# Patient Record
Sex: Female | Born: 2000 | Race: Black or African American | Hispanic: No | Marital: Single | Smoking: Never smoker
Health system: Southern US, Community
[De-identification: ages and names within clinical notes are randomized; demographics above are authoritative.]

---
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
## 2020-03-15 ENCOUNTER — Ambulatory Visit
Admission: EM | Admit: 2020-03-15 | Discharge: 2020-03-15 | Disposition: A | Discharge status: Other Acute Inpatient Hospital | Payer: Medicaid Other

## 2020-08-23 IMAGING — CR DG ANKLE COMPLETE 3+V*R*
3 series · 3 of 3 positions shown · non-contrast
Comparison: None.

CLINICAL DATA: Lateral right ankle pain after track injury

EXAM:
RIGHT ANKLE - COMPLETE 3+ VIEW

[ankle ap]
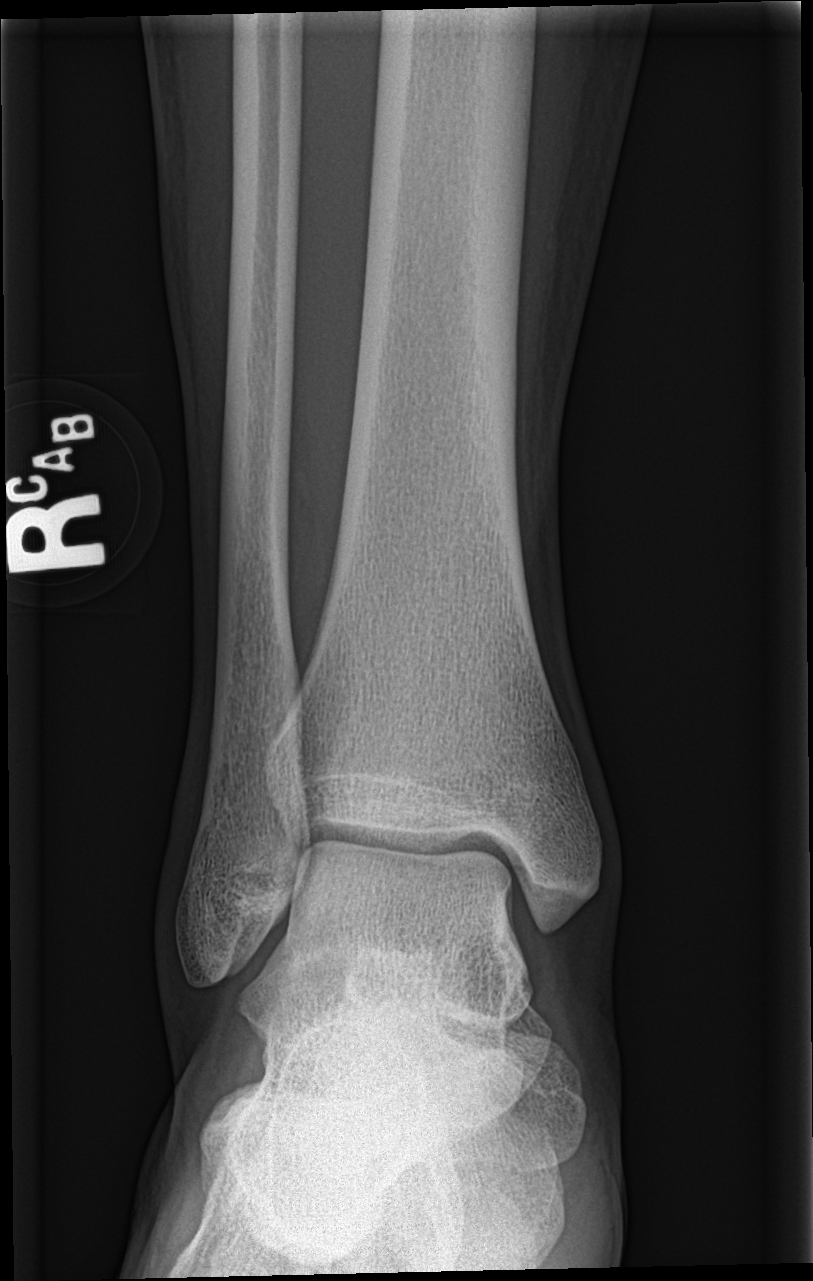

[ankle obl]
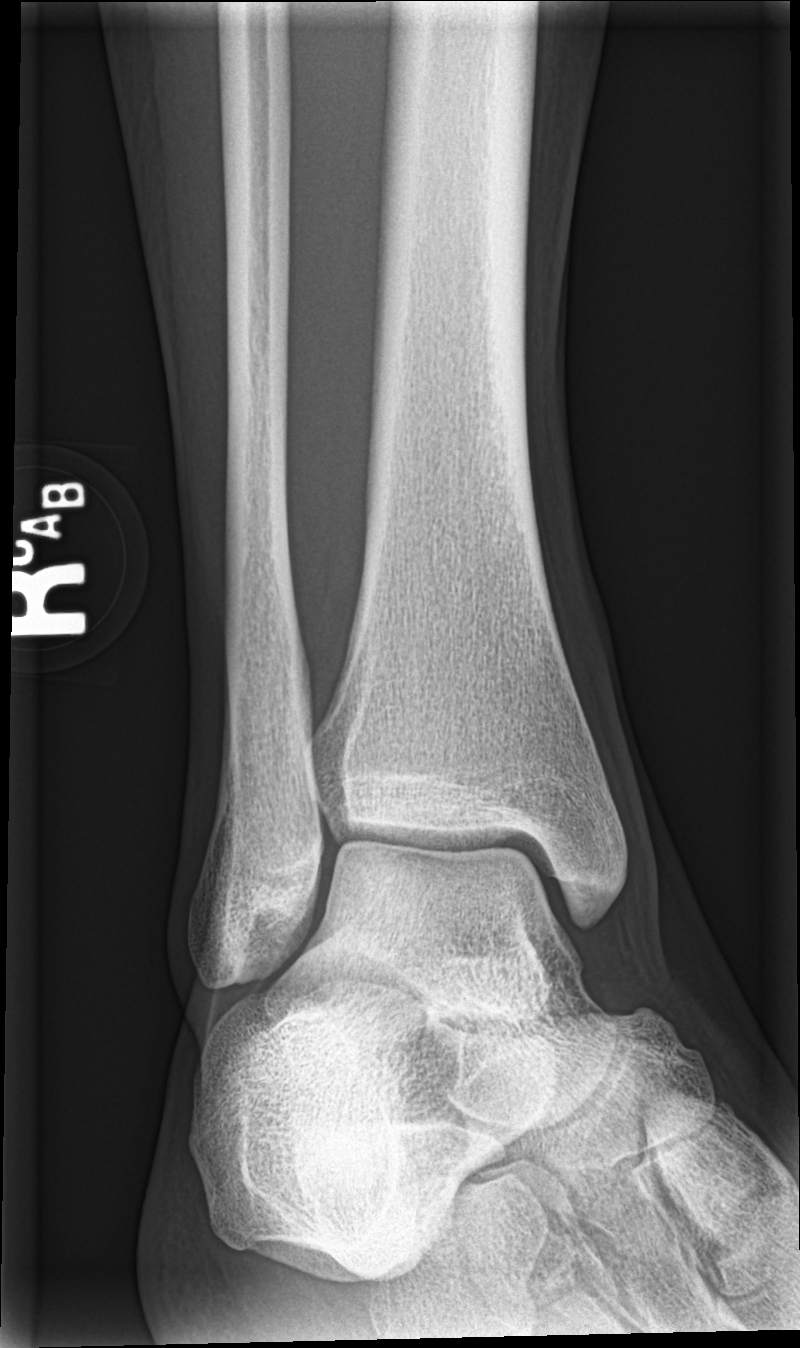

[ankle lat]
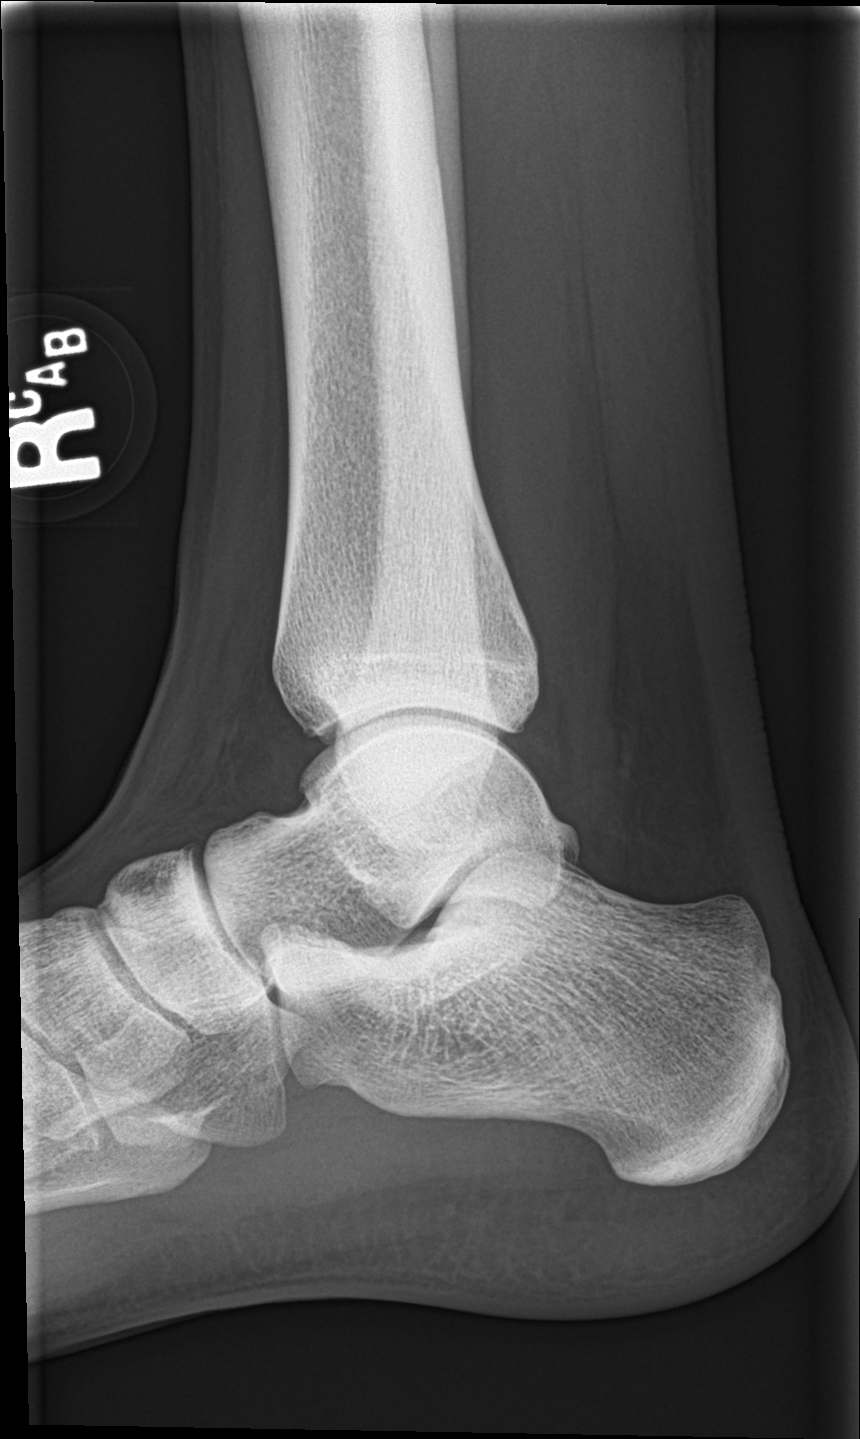

[3 of 3 positions shown; findings below may reference images not displayed]

FINDINGS: There is no evidence of fracture, dislocation, or joint effusion.
There is no evidence of arthropathy or other focal bone abnormality.
Soft tissues are unremarkable.
IMPRESSION: No fracture or subluxation in the right ankle.

## 2020-09-23 IMAGING — CR DG ANKLE COMPLETE 3+V*R*
3 series · 3 of 3 positions shown · non-contrast
Comparison: Radiographs dated 07/18/2019

CLINICAL DATA: Pain and bruising over the lateral malleolus from an
injury on 07/18/2019.

EXAM:
RIGHT ANKLE - COMPLETE 3+ VIEW

[ankle ap]
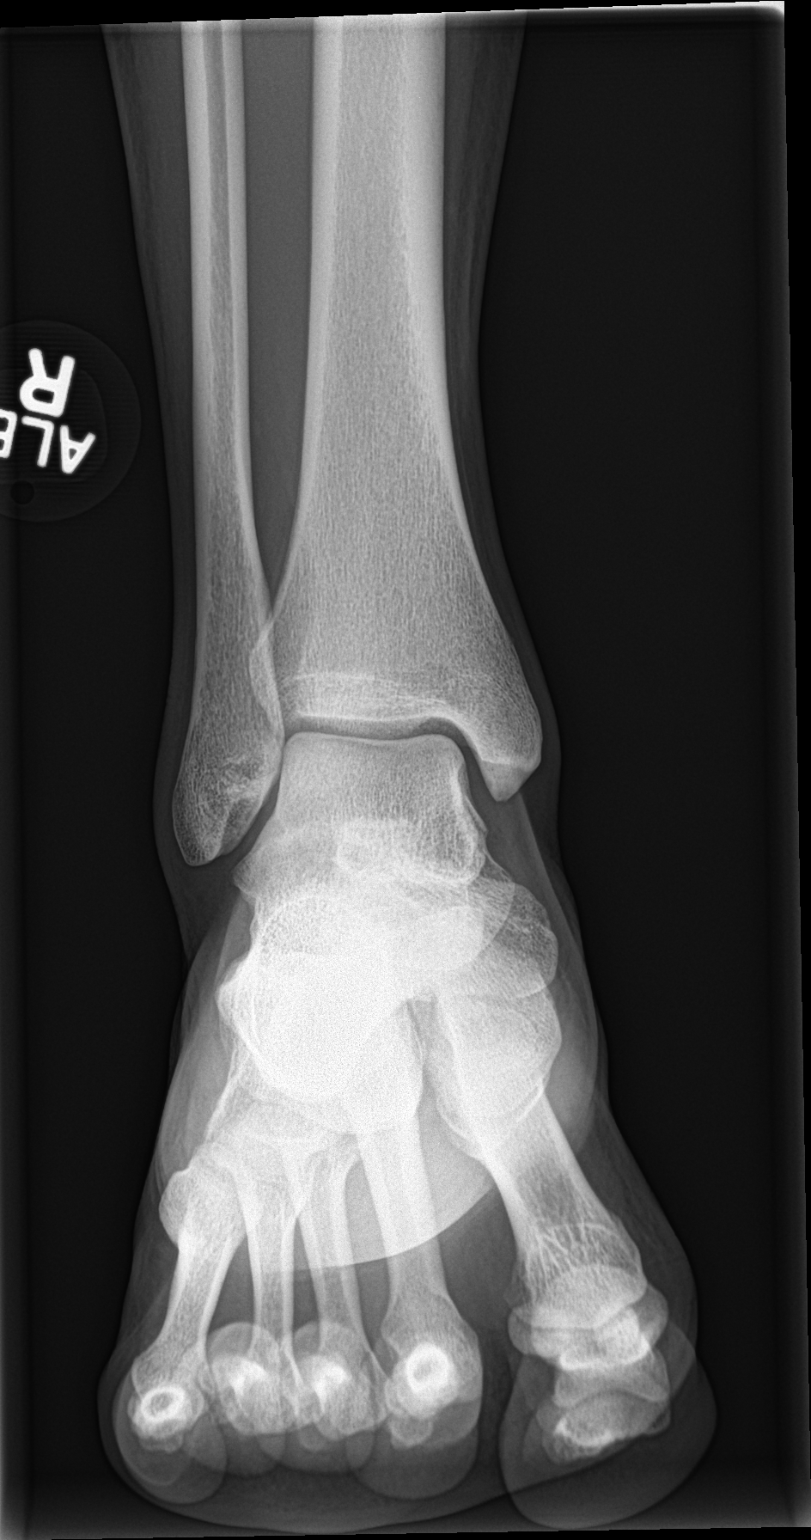

[ankle obl]
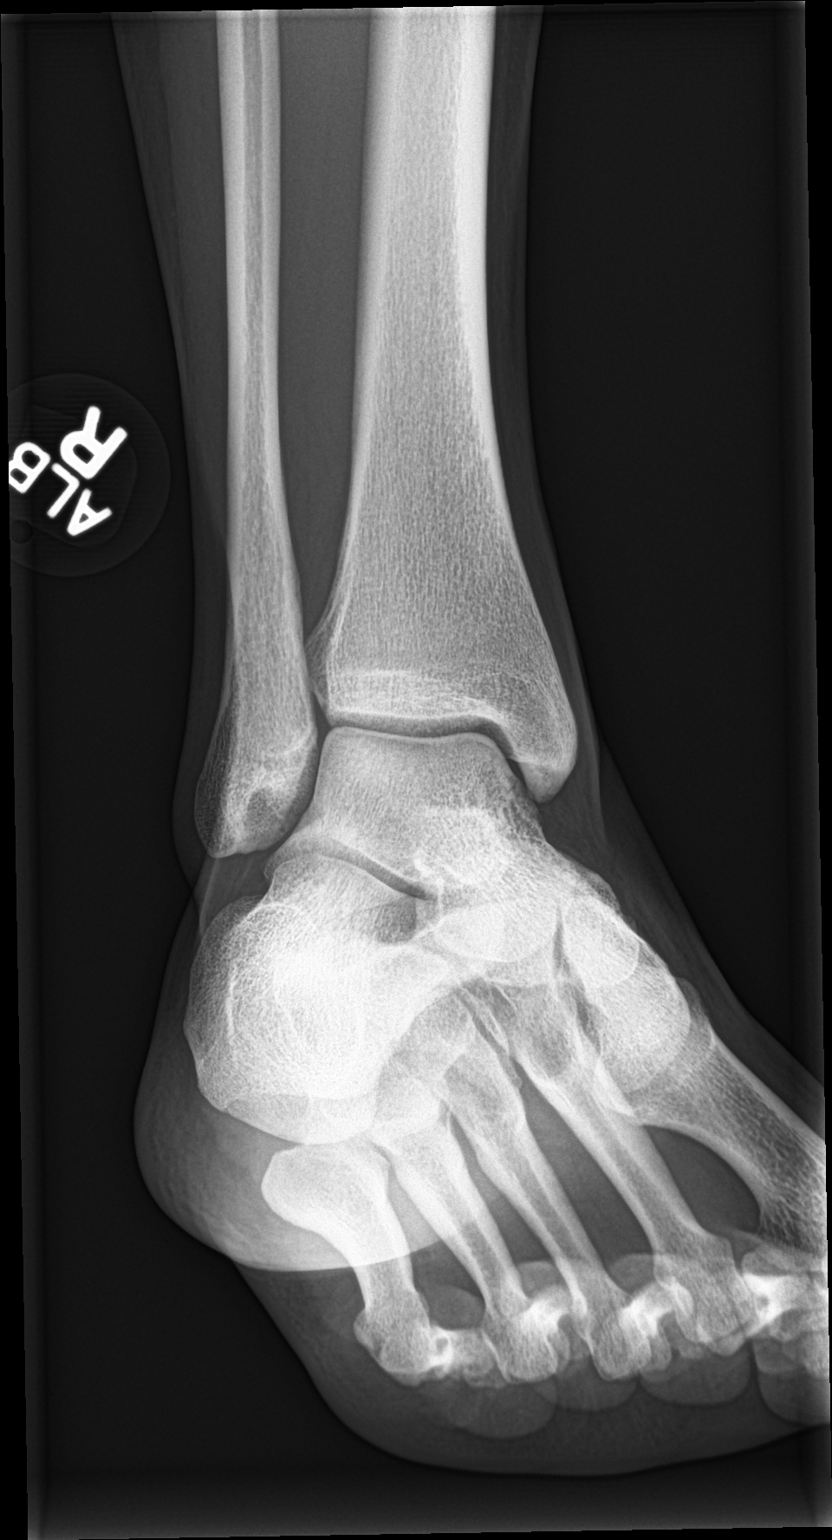

[ankle lat]
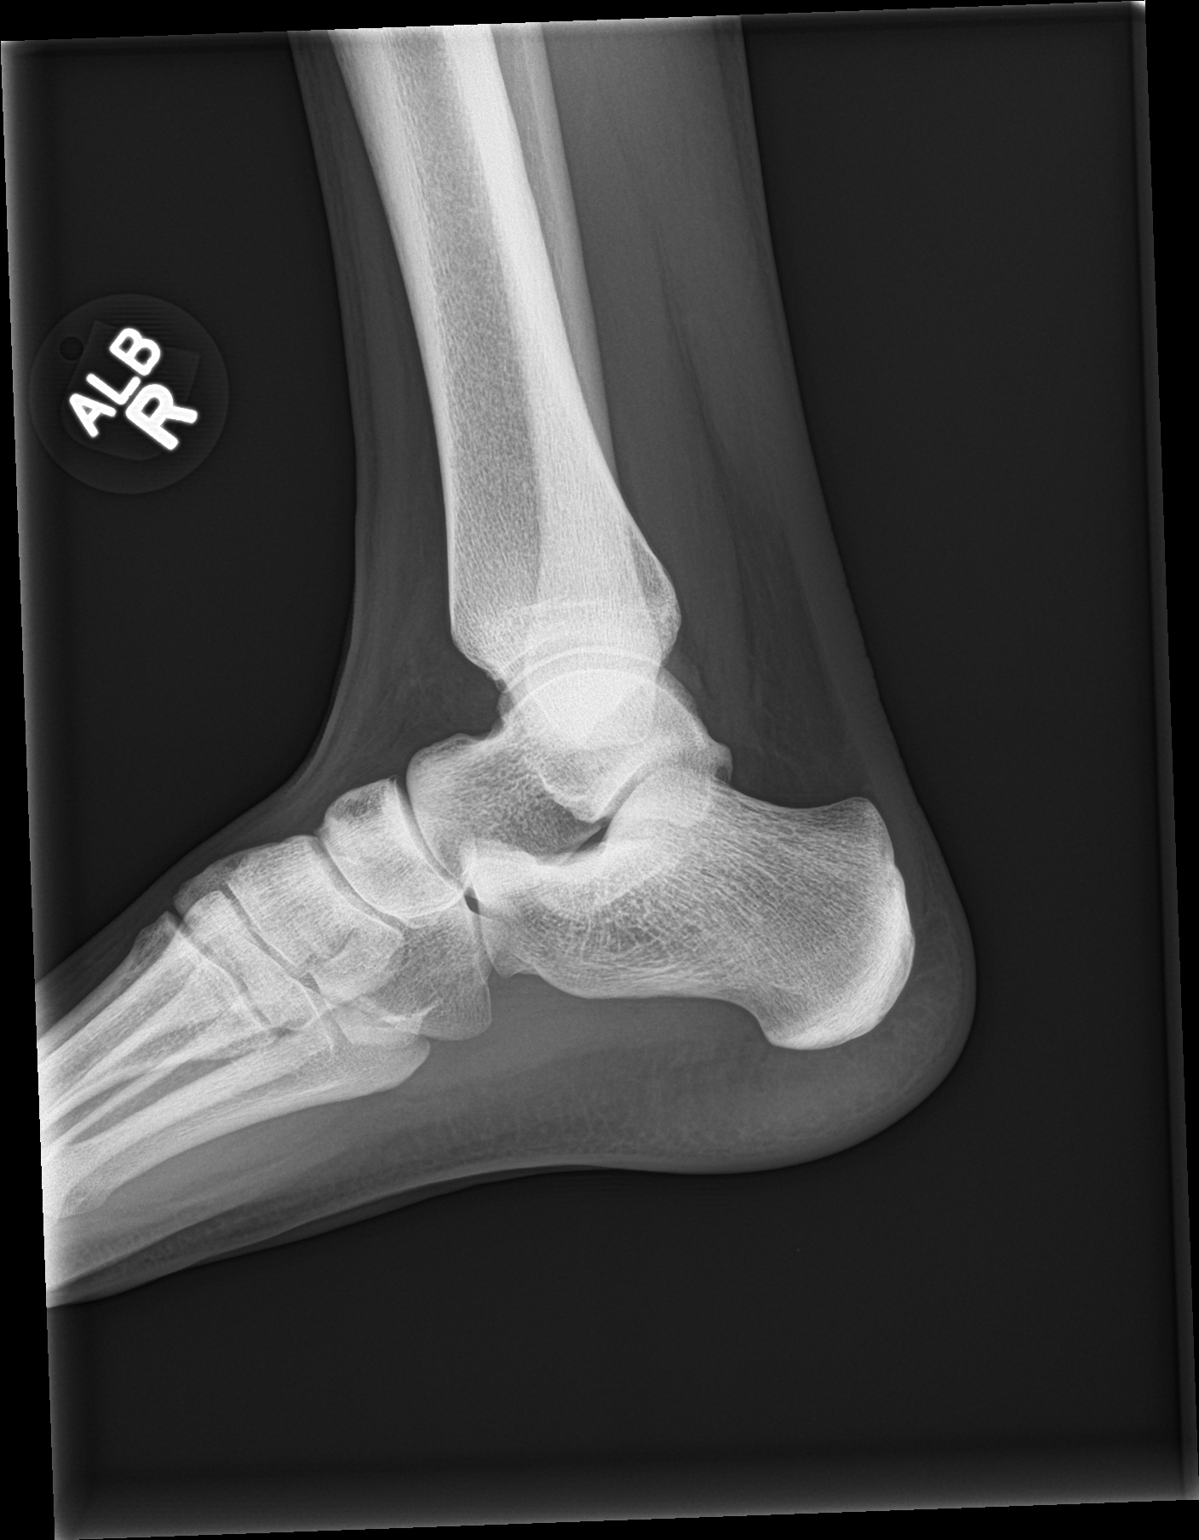

[3 of 3 positions shown; findings below may reference images not displayed]

FINDINGS: There is no evidence of fracture, dislocation, or joint effusion.
There is no evidence of arthropathy or other focal bone abnormality.
Soft tissues are unremarkable.
IMPRESSION: Normal exam.
